# Patient Record
Sex: Female | Born: 1980 | Race: White | Hispanic: No | Marital: Married | Smoking: Current every day smoker
Health system: Southern US, Community
[De-identification: ages and names within clinical notes are randomized; demographics above are authoritative.]

## PROBLEM LIST (undated history)

## (undated) DIAGNOSIS — J45909 Unspecified asthma, uncomplicated: Secondary | ICD-10-CM

## (undated) DIAGNOSIS — F32A Depression, unspecified: Secondary | ICD-10-CM

## (undated) DIAGNOSIS — F329 Major depressive disorder, single episode, unspecified: Secondary | ICD-10-CM

## (undated) HISTORY — PX: TONSILLECTOMY: SUR1361

## (undated) HISTORY — PX: TUBAL LIGATION: SHX77

## (undated) HISTORY — PX: CHOLECYSTECTOMY: SHX55

---

## 2008-08-11 ENCOUNTER — Ambulatory Visit: Payer: Self-pay | Admitting: Family Medicine

## 2008-08-11 DIAGNOSIS — F329 Major depressive disorder, single episode, unspecified: Secondary | ICD-10-CM | POA: Insufficient documentation

## 2008-08-11 DIAGNOSIS — J45909 Unspecified asthma, uncomplicated: Secondary | ICD-10-CM | POA: Insufficient documentation

## 2008-08-11 LAB — CONVERTED CEMR LAB: Rapid Strep: NEGATIVE

## 2009-01-14 ENCOUNTER — Ambulatory Visit: Payer: Self-pay | Admitting: Family Medicine

## 2009-01-15 ENCOUNTER — Encounter: Payer: Self-pay | Admitting: Family Medicine

## 2009-02-22 ENCOUNTER — Ambulatory Visit: Payer: Self-pay | Admitting: Family Medicine

## 2009-11-15 ENCOUNTER — Encounter: Admission: RE | Admit: 2009-11-15 | Discharge: 2009-11-15 | Payer: Self-pay | Admitting: Family Medicine

## 2010-06-12 ENCOUNTER — Ambulatory Visit (HOSPITAL_COMMUNITY): Payer: Self-pay | Admitting: Psychology

## 2010-06-28 ENCOUNTER — Ambulatory Visit (HOSPITAL_COMMUNITY): Payer: Self-pay | Admitting: Psychology

## 2010-07-19 ENCOUNTER — Ambulatory Visit (HOSPITAL_COMMUNITY): Payer: Self-pay | Admitting: Psychology

## 2010-08-29 ENCOUNTER — Ambulatory Visit
Admission: RE | Admit: 2010-08-29 | Discharge: 2010-08-29 | Payer: Self-pay | Source: Home / Self Care | Admitting: Emergency Medicine

## 2010-08-29 ENCOUNTER — Encounter (INDEPENDENT_AMBULATORY_CARE_PROVIDER_SITE_OTHER): Payer: Self-pay | Admitting: *Deleted

## 2010-09-07 NOTE — Letter (Signed)
Summary: Out of Work  [facility]  [address]   [place], [place] [address]   Phone: [phone]  Fax: [phone]    August 29, 2010   Employee:  Taytum LYNN THOMPSON Blitch    To Whom It May Concern:   For Medical reasons, please excuse the above named employee from work for the following dates:  August 29, 2010  If you need additional information, please feel free to contact our office.         Sincerely,    Lajean Saver RN

## 2010-09-07 NOTE — Assessment & Plan Note (Signed)
Summary: EAR PAIN,SORE THROAT,COUGH/TJ   Vital Signs:  Patient Profile:   30 Years Old Female CC:      Rt ear pain, scratchy throat,  Height:     63 inches Weight:      197 pounds O2 Sat:      98 % O2 treatment:    Room Air Temp:     98.9 degrees F oral Pulse rate:   87 / minute Pulse rhythm:   regular Resp:     14 per minute BP sitting:   119 / 84  (left arm) Cuff size:   regular  Vitals Entered By: Emilio Math (August 29, 2010 6:30 PM)                  Current Allergies: No known allergies History of Present Illness Chief Complaint: Rt ear pain, scratchy throat,  History of Present Illness: R constant ear pain, scratchy throat x2 days.  Cough x5 days.  No F/C/N/V.  Otherwise is feeling ok.  Tried OTC mucinex which didn't help much.    REVIEW OF SYSTEMS Constitutional Symptoms      Denies fever, chills, night sweats, weight loss, weight gain, and fatigue.  Eyes       Denies change in vision, eye pain, eye discharge, glasses, contact lenses, and eye surgery. Ear/Nose/Throat/Mouth       Complains of ear pain and sore throat.      Denies hearing loss/aids, change in hearing, ear discharge, dizziness, frequent runny nose, frequent nose bleeds, sinus problems, hoarseness, and tooth pain or bleeding.  Respiratory       Complains of dry cough.      Denies productive cough, wheezing, shortness of breath, asthma, bronchitis, and emphysema/COPD.  Cardiovascular       Denies murmurs, chest pain, and tires easily with exhertion.    Gastrointestinal       Denies stomach pain, nausea/vomiting, diarrhea, constipation, blood in bowel movements, and indigestion. Genitourniary       Denies painful urination, kidney stones, and loss of urinary control. Neurological       Denies paralysis, seizures, and fainting/blackouts. Musculoskeletal       Denies muscle pain, joint pain, joint stiffness, decreased range of motion, redness, swelling, muscle weakness, and gout.  Skin  Denies bruising, unusual mles/lumps or sores, and hair/skin or nail changes.  Psych       Denies mood changes, temper/anger issues, anxiety/stress, speech problems, depression, and sleep problems.  Past History:  Past Medical History: Reviewed history from 08/11/2008 and no changes required. Asthma Depression  Past Surgical History: Reviewed history from 08/11/2008 and no changes required. Tubal ligation  Family History: Reviewed history from 02/22/2009 and no changes required. Mother, Healthy Father, Diabetes Sister, Healthy Brother, Healthy  Social History: Reviewed history from 02/22/2009 and no changes required. 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  R canal normal.  L ear is normal. Nasal: mucosa pink, nonedematous, no septal deviation, turbinates normal Oral/Pharynx: tongue normal, posterior pharynx without erythema or exudate Chest/Lungs: no rales, wheezes, or rhonchi bilateral, breath sounds equal without effort Heart: regular rate and  rhythm, no murmur MSE: oriented to time, place, and person Assessment New Problems: OTITIS MEDIA, RIGHT (ICD-382.9)   Plan New Medications/Changes: CHERATUSSIN AC 100-10 MG/5ML SYRP (GUAIFENESIN-CODEINE) 10cc at bedtime as  needed cough  #4oz x 0, 08/29/2010, Hoyt Koch MD AMOXICILLIN 875 MG TABS (AMOXICILLIN) 1 by mouth two times a day for 1 week  #14 x 0, 08/29/2010, Hoyt Koch MD  New Orders: Est. Patient Level III [phone] Planning Comments:   Rx for Amox for ear infection Rx for Cheratussin at bedtime for coughing.  OTC cough meds during the day Ibu as needed, hydration, rest Work note for today Follow-up with your primary care physician if not improving or if getting worse   The  patient and/or caregiver has been counseled thoroughly with regard to medications prescribed including dosage, schedule, interactions, rationale for use, and possible side effects and they verbalize understanding.  Diagnoses and expected course of recovery discussed and will return if not improved as expected or if the condition worsens. Patient and/or caregiver verbalized understanding.  Prescriptions: CHERATUSSIN AC 100-10 MG/5ML SYRP (GUAIFENESIN-CODEINE) 10cc at bedtime as needed cough  #4oz x 0   Entered and Authorized by:   Hoyt Koch MD   Signed by:   Hoyt Koch MD on 08/29/2010   Method used:   Handwritten   RxID:   [id number] AMOXICILLIN 875 MG TABS (AMOXICILLIN) 1 by mouth two times a day for 1 week  #14 x 0   Entered and Authorized by:   Hoyt Koch MD   Signed by:   Hoyt Koch MD on 08/29/2010   Method used:   Handwritten   RxID:   [id number]   Orders Added: 1)  Est. Patient Level III [29528]

## 2010-09-27 ENCOUNTER — Encounter: Payer: Self-pay | Admitting: Family Medicine

## 2010-09-27 ENCOUNTER — Ambulatory Visit (INDEPENDENT_AMBULATORY_CARE_PROVIDER_SITE_OTHER): Payer: 59 | Admitting: Family Medicine

## 2010-09-27 DIAGNOSIS — J209 Acute bronchitis, unspecified: Secondary | ICD-10-CM

## 2010-09-27 DIAGNOSIS — J45909 Unspecified asthma, uncomplicated: Secondary | ICD-10-CM

## 2010-09-28 ENCOUNTER — Telehealth (INDEPENDENT_AMBULATORY_CARE_PROVIDER_SITE_OTHER): Payer: Self-pay | Admitting: *Deleted

## 2010-10-03 NOTE — Letter (Signed)
Summary: Out of Work  [facility]  [address]   [place], [place] [address]   Phone: [phone]  Fax: [phone]    September 27, 2010   Employee:  Rukiya LYNN THOMPSON Zachow    To Whom It May Concern:  Laura Juarez was evaluated in our clinic this afternoon.    If you need additional information, please feel free to contact our office.         Sincerely,    Donna Christen MD

## 2010-10-03 NOTE — Assessment & Plan Note (Signed)
Summary: EAR PAIN,COUGH,WHEEZING AT NIGHT/TJ rm 4   Vital Signs:  Patient Profile:   30 Years Old Female CC:      cough, ear pain Height:     63 inches Weight:      202 pounds O2 Sat:      97 % O2 treatment:    Room Air Temp:     98.6 degrees F oral Pulse rate:   80 / minute Resp:     16 per minute BP sitting:   111 / 66  (left arm) Cuff size:   regular  Vitals Entered By: Clemens Catholic LPN (September 27, 2010 12:35 PM)                  Updated Prior Medication List: [facility] ALLERGY RELIEF 10 MG TBDP (LORATADINE) 1 tab by mouth once daily CYMBALTA 30 MG CPEP (DULOXETINE HCL) as directed [facility] LORATADINE 10 MG TABS (LORATADINE) as directed  Current Allergies (reviewed today): No known allergies History of Present Illness Chief Complaint: cough, ear pain History of Present Illness:  Subjective:  Patient reports that she improved somewhat after previous visit, but cough lingered on, and about two weeks ago she became worse with a pressure-like discomfort over the anterior chest, increased cough and sinus congestion.  Over the past two days she has had sweats at night.  No shortness of breath but wheezes.  Cough generally non-productive.  REVIEW OF SYSTEMS Constitutional Symptoms      Denies fever, chills, night sweats, weight loss, weight gain, and fatigue.  Eyes       Denies change in vision, eye pain, eye discharge, glasses, contact lenses, and eye surgery. Ear/Nose/Throat/Mouth       Complains of ear pain.      Denies hearing loss/aids, change in hearing, ear discharge, dizziness, frequent runny nose, frequent nose bleeds, sinus problems, sore throat, hoarseness, and tooth pain or bleeding.  Respiratory       Complains of dry cough and wheezing.      Denies productive cough, shortness of breath, asthma, bronchitis, and emphysema/COPD.  Cardiovascular       Denies murmurs, chest pain, and tires easily with exhertion.    Gastrointestinal       Denies stomach pain,  nausea/vomiting, diarrhea, constipation, blood in bowel movements, and indigestion. Genitourniary       Denies painful urination, kidney stones, and loss of urinary control. Neurological       Denies paralysis, seizures, and fainting/blackouts. Musculoskeletal       Denies muscle pain, joint pain, joint stiffness, decreased range of motion, redness, swelling, muscle weakness, and gout.  Skin       Denies bruising, unusual mles/lumps or sores, and hair/skin or nail changes.  Psych       Denies mood changes, temper/anger issues, anxiety/stress, speech problems, depression, and sleep problems. Other Comments: pt c/o dry cough, tickle in throat, chest feels heavy, and RT ear pain x 1wk. she has taken Advil cold and sinus.   Past History:  Past Medical History: Reviewed history from 08/11/2008 and no changes required. Asthma Depression  Past Surgical History: Reviewed history from 08/11/2008 and no changes required. Tubal ligation  Family History: Reviewed history from 02/22/2009 and no changes required. Mother, Healthy Father, Diabetes Sister, Healthy Brother, Healthy  Social History: Reviewed history from 02/22/2009 and no changes required. 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  Extraocular movement is intact.  Conjunctivae are not inflamed.  Ears:  Canals normal.  Tympanic membranes normal.   Nose:  Normal septum.  Normal turbinates, mildly congested.   No sinus tenderness present.  Pharynx:  Normal  Neck:  Supple.  Slightly tender shotty anterior/posterior nodes are palpated bilaterally.  Lungs:  Clear to auscultation.  Breath sounds are equal.  Chest:  Tenderness over mid-sternum Heart:  Regular rate and rhythm without murmurs, rubs, or gallops.  Abdomen:   Nontender without masses or hepatosplenomegaly.  Bowel sounds are present.  No CVA or flank tenderness.  Extremities:  No edema.  Pedal pulses are full and equal.  Skin:  No rash Assessment New Problems: BRONCHITIS, ACUTE (ICD-466.0)  SUSPECT NEW VIRAL URI, NOW WITH DEVELOPING BRONCHITIS  Plan New Medications/Changes: BENZONATATE 200 MG CAPS (BENZONATATE) One by mouth hs as needed cough  #12 x 0, 09/27/2010, Donna Christen MD FLUTICASONE PROPIONATE 50 MCG/ACT SUSP (FLUTICASONE PROPIONATE) 2 sprays in each nostril once daily  #One x 1, 09/27/2010, Donna Christen MD CLARITHROMYCIN 500 MG TABS (CLARITHROMYCIN) One Tab by mouth two times a day  #20 x 0, 09/27/2010, Donna Christen MD PREDNISONE 10 MG TABS (PREDNISONE) 2 PO BID for 2 days, then 1 BID for 2 days, then 1 daily for 2 days.  Take PC  #14 x 0, 09/27/2010, Donna Christen MD  New Orders: Pulse Oximetry (single measurment) [94760] Est. Patient Level III [16109] Planning Comments:   Begin Biaxin, tapering course of prednsone, expectorant/decongestant, fluticasone nasal spray, cough suppressant at bedtime.  Increase fluid intake Followup with PCP if not improving 7 to 10 days   The patient and/or caregiver has been counseled thoroughly with regard to medications prescribed including dosage, schedule, interactions, rationale for use, and possible side effects and they verbalize understanding.  Diagnoses and expected course of recovery discussed and will return if not improved as expected or if the condition worsens. Patient and/or caregiver verbalized understanding.  Prescriptions: BENZONATATE 200 MG CAPS (BENZONATATE) One by mouth hs as needed cough  #12 x 0   Entered and Authorized by:   Donna Christen MD   Signed by:   Donna Christen MD on 09/27/2010   Method used:   Print then Give to Patient   RxID:   [id number] FLUTICASONE PROPIONATE 50 MCG/ACT SUSP (FLUTICASONE PROPIONATE) 2 sprays in each nostril once daily  #One x 1   Entered  and Authorized by:   Donna Christen MD   Signed by:   Donna Christen MD on 09/27/2010   Method used:   Print then Give to Patient   RxID:   [id number] CLARITHROMYCIN 500 MG TABS (CLARITHROMYCIN) One Tab by mouth two times a day  #20 x 0   Entered and Authorized by:   Donna Christen MD   Signed by:   Donna Christen MD on 09/27/2010   Method used:   Print then Give to Patient   RxID:   [id number] PREDNISONE 10 MG TABS (PREDNISONE) 2 PO BID for 2 days, then 1 BID for 2 days, then 1 daily for 2 days.  Take PC  #14 x 0   Entered and Authorized by:   Donna Christen MD   Signed by:   Donna Christen MD on 09/27/2010   Method used:   Print then Give to Patient   RxID:   [id number]   Patient Instructions: 1)  Take Mucinex D (  guaifenesin with decongestant) twice daily for congestion. 2)  Increase fluid intake, rest. 3)  May use Afrin nasal spray (or generic oxymetazoline) twice daily for about 5 days (use Afrin about 15 minutes before using prescription nose spray).   Also recommend using saline nasal spray several times daily and/or saline nasal irrigation. 4)    5)  Followup with ENT doctor if not improving 7 to 10 days.   Orders Added: 1)  Pulse Oximetry (single measurment) [94760] 2)  Est. Patient Level III [98119]    Laboratory Results  Date/Time Received: September 27, 2010 1:28 PM  Date/Time Reported: September 27, 2010 1:28 PM   Other Tests  Rapid Strep: negative  Kit Test Internal QC: Negative   (Normal Range: Negative)

## 2010-10-03 NOTE — Progress Notes (Signed)
  Phone Note Outgoing Call   Call placed by: Clemens Catholic LPN,  September 28, 2010 10:24 AM Call placed to: Patient Summary of Call: call back: pts phone number has been disconnected. Initial call taken by: Clemens Catholic LPN,  September 28, 2010 10:24 AM

## 2010-10-03 NOTE — Assessment & Plan Note (Signed)
Summary: Additional testing  Rapid strep test negative

## 2010-10-29 ENCOUNTER — Encounter: Payer: Self-pay | Admitting: Emergency Medicine

## 2010-10-29 ENCOUNTER — Inpatient Hospital Stay (INDEPENDENT_AMBULATORY_CARE_PROVIDER_SITE_OTHER)
Admission: RE | Admit: 2010-10-29 | Discharge: 2010-10-29 | Disposition: A | Discharge status: Home / Self Care | Payer: 59 | Source: Ambulatory Visit | Attending: Emergency Medicine | Admitting: Emergency Medicine

## 2010-10-29 DIAGNOSIS — J069 Acute upper respiratory infection, unspecified: Secondary | ICD-10-CM

## 2010-10-29 DIAGNOSIS — M549 Dorsalgia, unspecified: Secondary | ICD-10-CM

## 2010-11-07 NOTE — Assessment & Plan Note (Signed)
Summary: POSS SINUS INF AND LOWER BACK PAIN/WSE(rm5)   Vital Signs:  Patient Profile:   30 Years Old Female CC:      Cold & URI symptoms/back pain Height:     63 inches Weight:      206.8 pounds O2 Sat:      99 % O2 treatment:    Room Air Temp:     98.9 degrees F oral Pulse rate:   84 / minute Resp:     20 per minute BP sitting:   114 / 76  (left arm) Cuff size:   regular  Pt. in pain?   yes    Location:   back    Intensity:   7    Type:       throbbing  Vitals Entered By: Linton Flemings RN (October 29, 2010 12:11 PM)                   Updated Prior Medication List: [facility] ALLERGY RELIEF 10 MG TBDP (LORATADINE) 1 tab by mouth once daily CYMBALTA 30 MG CPEP (DULOXETINE HCL) as directed [facility] LORATADINE 10 MG TABS (LORATADINE) as directed PREDNISONE 10 MG TABS (PREDNISONE) 2 PO BID for 2 days, then 1 BID for 2 days, then 1 daily for 2 days.  Take PC CLARITHROMYCIN 500 MG TABS (CLARITHROMYCIN) One Tab by mouth two times a day FLUTICASONE PROPIONATE 50 MCG/ACT SUSP (FLUTICASONE PROPIONATE) 2 sprays in each nostril once daily BENZONATATE 200 MG CAPS (BENZONATATE) One by mouth hs as needed cough  Current Allergies: No known allergies History of Present Illness History from: patient Chief Complaint: Cold & URI symptoms/back pain History of Present Illness: 1) Back pain since this morning.  Was picking clothes off the floor and felt tight spasm.  She recently felt the same way in her neck, went to ER, given Flexeril. The patient presents today with back pain.  Today with constant soreness in L lower back. Worse with: movement Better with: rest Trauma: no Bladder/bowel incontinence: no Weakness: no Fever/chills: no Night pain: no Unexplained weight loss: no Cancer/immunosuppression: no PMH of osteoporosis or chronic steroid use: no   50) 30 Years Old Female complains of onset of cold symptoms for 2-3 days.  Hedda has been using nothign OTC. + sore throat + cough No  pleuritic pain No wheezing + nasal congestion + post-nasal drainage No sinus pain/pressure No chest congestion No itchy/red eyes + earache No hemoptysis No SOB No chills/sweats No fever No nausea No vomiting No abdominal pain No diarrhea No skin rashes No fatigue No myalgias No headache   REVIEW OF SYSTEMS Constitutional Symptoms       Complains of fever.     Denies chills, night sweats, weight loss, weight gain, and fatigue.  Eyes       Denies change in vision, eye pain, eye discharge, glasses, contact lenses, and eye surgery. Ear/Nose/Throat/Mouth       Complains of ear pain and sore throat.      Denies hearing loss/aids, change in hearing, ear discharge, dizziness, frequent runny nose, frequent nose bleeds, sinus problems, hoarseness, and tooth pain or bleeding.  Respiratory       Complains of dry cough, wheezing, and shortness of breath.      Denies productive cough, asthma, bronchitis, and emphysema/COPD.  Cardiovascular       Denies murmurs, chest pain, and tires easily with exhertion.    Gastrointestinal       Denies stomach pain, nausea/vomiting, diarrhea, constipation, blood  in bowel movements, and indigestion. Genitourniary       Denies painful urination, kidney stones, and loss of urinary control. Neurological       Complains of weakness.      Denies headaches, loss of or changes in sensation, numbness, tngling, tremors, paralysis, seizures, and fainting/blackouts. Musculoskeletal       Denies muscle pain, joint pain, joint stiffness, decreased range of motion, redness, swelling, muscle weakness, and gout.  Skin       Denies bruising, unusual mles/lumps or sores, and hair/skin or nail changes.  Psych       Denies mood changes, temper/anger issues, anxiety/stress, speech problems, depression, and sleep problems. Other Comments: back pain started this am picking up a pair of pants. Treated for URI last month improved some but now symptoms are coming  back   Past History:  Past Medical History: Reviewed history from 08/11/2008 and no changes required. Asthma Depression  Past Surgical History: Reviewed history from 08/11/2008 and no changes required. Tubal ligation  Family History: Reviewed history from 02/22/2009 and no changes required. Mother, Healthy Father, Diabetes Sister, Healthy Brother, Healthy  Social History: Reviewed history from 02/22/2009 and no changes required. 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 General appearance: well developed, well nourished, no acute distress Ears: normal, no lesions or deformities Nasal: clear discharge Oral/Pharynx: pharyngeal mild erythema without exudate, uvula midline without deviation Chest/Lungs: no rales, wheezes, or rhonchi bilateral, breath sounds equal without effort Heart: regular rate and  rhythm, no murmur Back: FROM, SLR neg, mild L paraspinal lumbar spasms and tenderness, no central/bony tenderness MSE: oriented to time, place, and person Assessment New Problems: UPPER RESPIRATORY INFECTION, ACUTE (ICD-465.9) BACK PAIN (ICD-724.5)   Plan New Medications/Changes: TRAMADOL HCL 50 MG TABS (TRAMADOL HCL) 1 by mouth q8 hrs as needed for pain  #15 x 0, 10/29/2010, Hoyt Koch MD PREDNISONE 20 MG TABS (PREDNISONE) 1 by mouth two times a day for 4 days, no taper  #QS x 0, 10/29/2010, Hoyt Koch MD FLEXERIL 10 MG TABS (CYCLOBENZAPRINE HCL) 1 by mouth three times a day as needed for spasms  #20 x 0, 10/29/2010, Hoyt Koch MD  New Orders: Est. Patient Level IV [phone] Services provided After hours-Weekends-Holidays [99051] Rapid Strep [14782] Planning Comments:   1) LBP, muscle spasm.  Heating pad.  Rx for Tramadol, Prednisone, Flexeril.  If neck/back pain is recurrent, will need to see PT and/or back ortho. 2) Rapid strep negative.  No antibiotic given since viral at this time.  Use nasal saline solution (over the counter) at least 3 times a day. Use over the counter decongestants like Zyrtec-D every 12 hours as needed to help with congestion.  Follow up with your primary doctor  if no improvement in 5-7 days, sooner if increasing pain, fever, or new symptoms.    The patient and/or caregiver has been counseled thoroughly with regard to medications prescribed including dosage, schedule, interactions, rationale for use, and possible side effects and they verbalize understanding.  Diagnoses and expected course of recovery discussed and will return if not improved as expected or if the condition worsens. Patient and/or caregiver verbalized understanding.  Prescriptions: TRAMADOL HCL 50 MG TABS (TRAMADOL HCL) 1 by mouth q8 hrs as needed for pain  #15 x 0   Entered and Authorized by:   Hoyt Koch MD   Signed by:   Hoyt Koch MD on 10/29/2010  Method used:   Print then Give to Patient   RxID:   [id number] PREDNISONE 20 MG TABS (PREDNISONE) 1 by mouth two times a day for 4 days, no taper  #QS x 0   Entered and Authorized by:   Hoyt Koch MD   Signed by:   Hoyt Koch MD on 10/29/2010   Method used:   Print then Give to Patient   RxID:   [id number] FLEXERIL 10 MG TABS (CYCLOBENZAPRINE HCL) 1 by mouth three times a day as needed for spasms  #20 x 0   Entered and Authorized by:   Hoyt Koch MD   Signed by:   Hoyt Koch MD on 10/29/2010   Method used:   Print then Give to Patient   RxID:   [id number]   Orders Added: 1)  Est. Patient Level IV [03474] 2)  Services provided After hours-Weekends-Holidays [99051] 3)  Rapid Strep [25956]    Laboratory Results  Date/Time Received: October 29, 2010 12:30 PM  Date/Time Reported: October 29, 2010 12:30 PM   Other Tests  Rapid Strep: negative  Kit Test Internal QC: Negative   (Normal Range: Negative)

## 2011-01-16 IMAGING — CT CT ABD-PELV W/ CM
2 of 4 series · 17 of 46 positions shown, 19 images · IV contrast (agent unspecified)
Comparison: None.

CLINICAL DATA: Right lower quadrant pain for 4 days

CT ABDOMEN AND PELVIS WITH CONTRAST
TECHNIQUE: Multidetector CT imaging of the abdomen and pelvis was
performed following the standard protocol during bolus
administration of intravenous contrast.
Contrast: 100 ml Ymnipaque-T55

[Series 2: portal · axial · portal-venous · 0.70mm/px · z∈[+370,+775]mm · 14 of 89 slices shown, 16 images]
[im 4/89  soft-tissue]
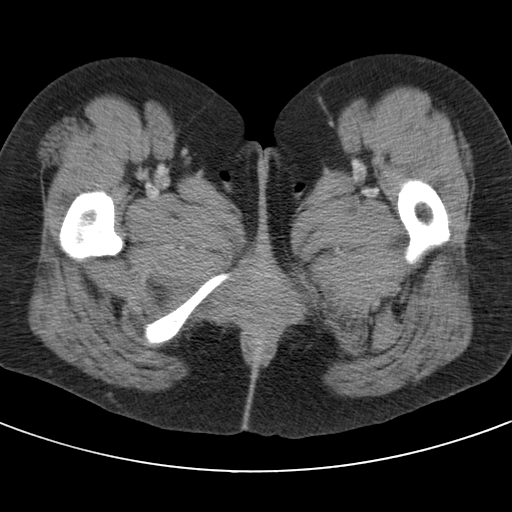
[im 4/89  bone]
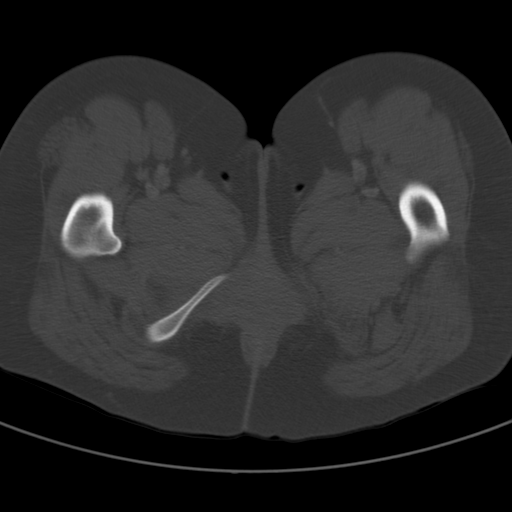
[im 12/89  soft-tissue]
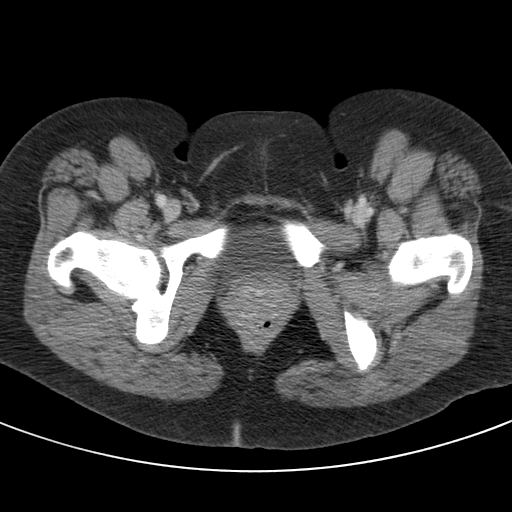
[im 19/89  soft-tissue]
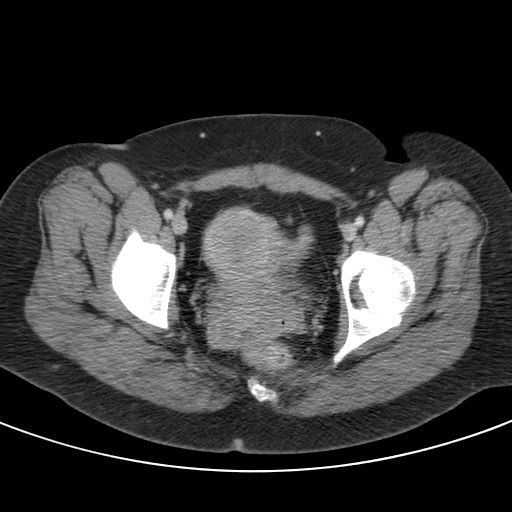
[im 23/89  soft-tissue]
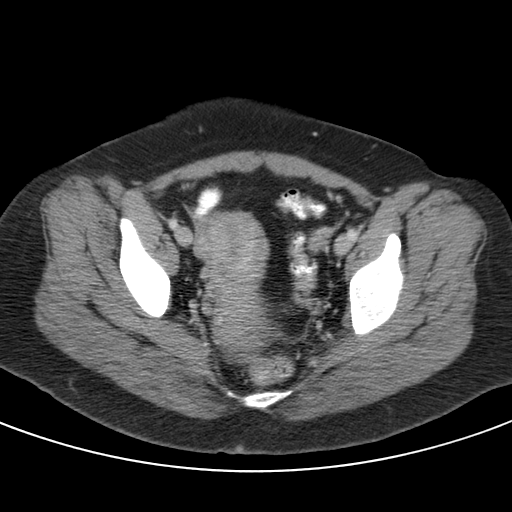
[im 30/89  soft-tissue]
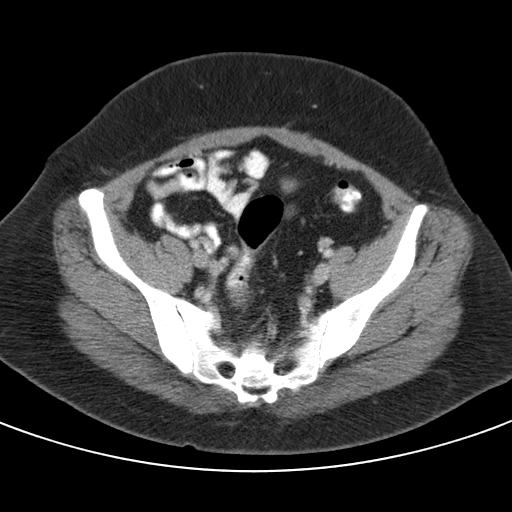
[im 37/89  soft-tissue]
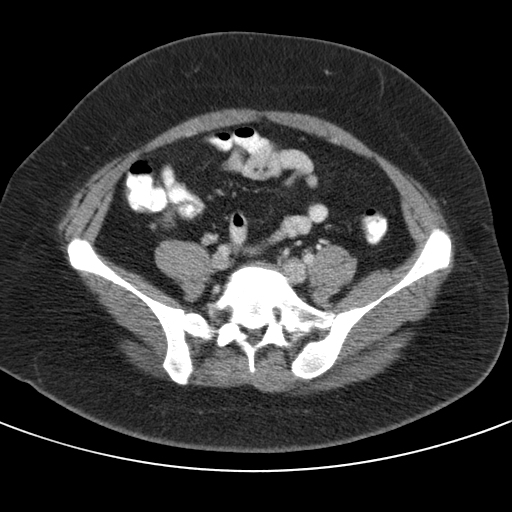
[im 41/89  soft-tissue]
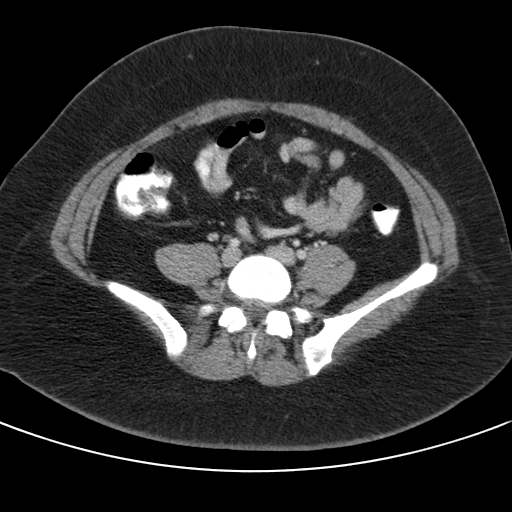
[im 48/89  soft-tissue]
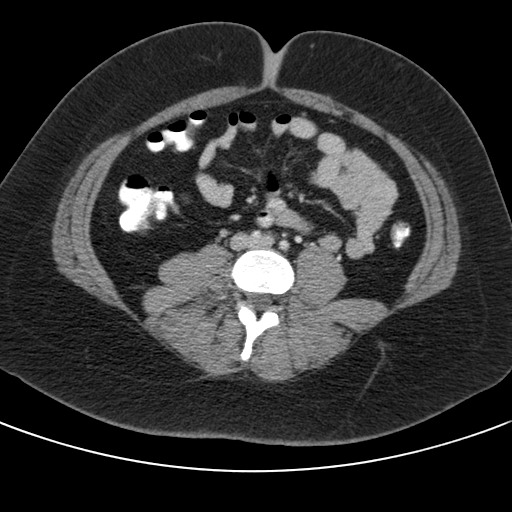
[im 52/89  soft-tissue]
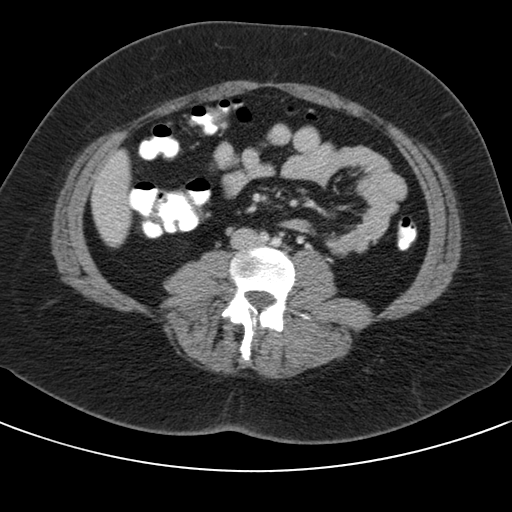
[im 52/89  bone]
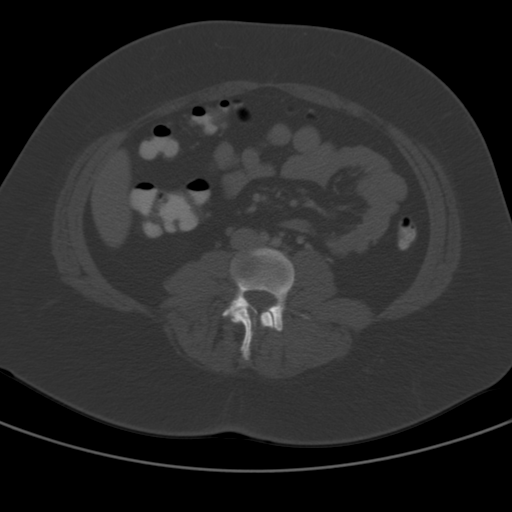
[im 59/89  soft-tissue]
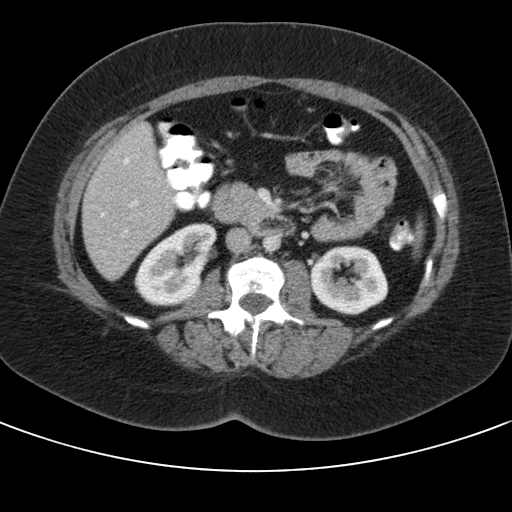
[im 67/89  soft-tissue]
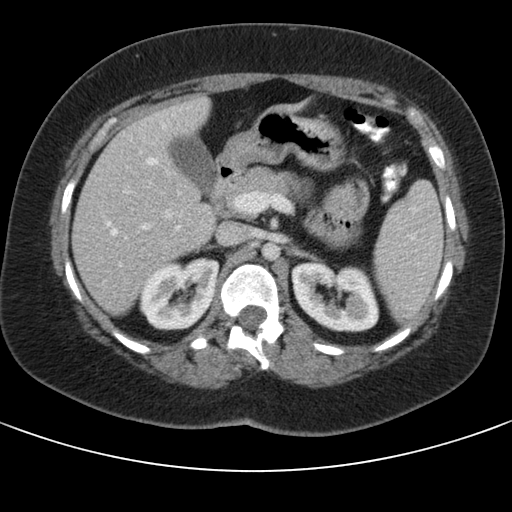
[im 70/89  soft-tissue]
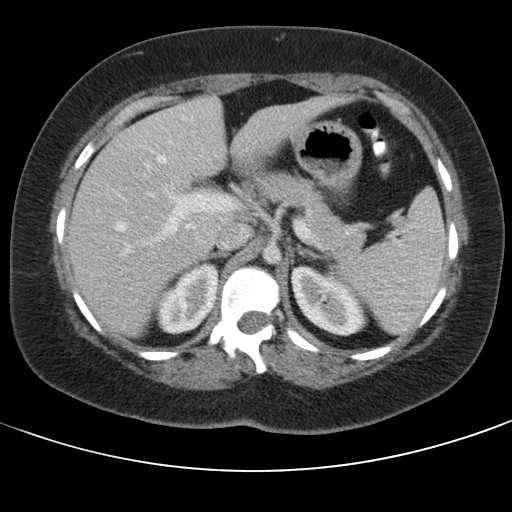
[im 78/89  soft-tissue]
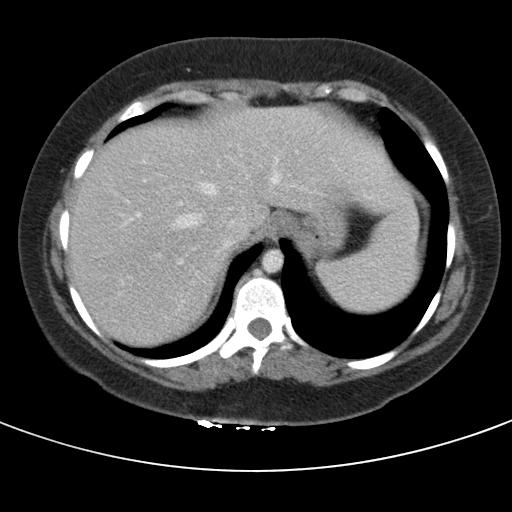
[im 85/89  soft-tissue]
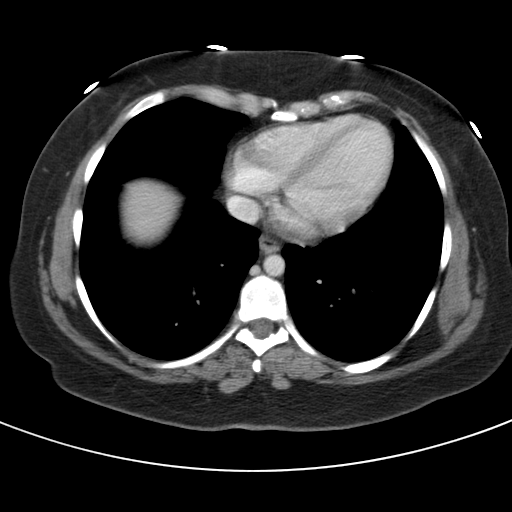

[cor · coronal · 0.86mm/px · 3 of 76 slices shown]
[im 26/76  soft-tissue]
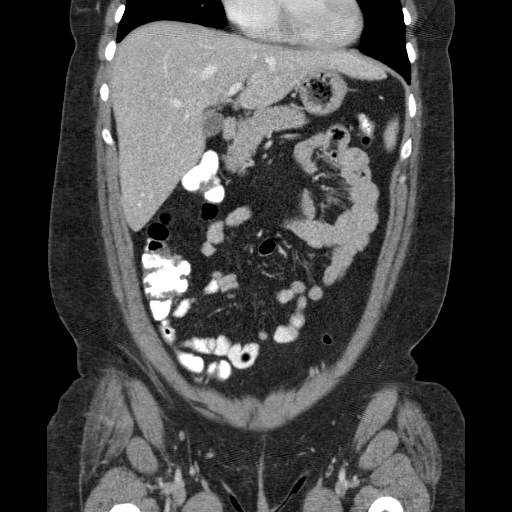
[im 34/76  soft-tissue]
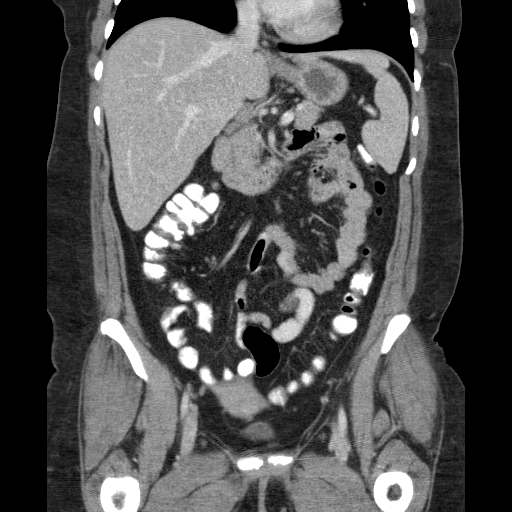
[im 42/76  soft-tissue]
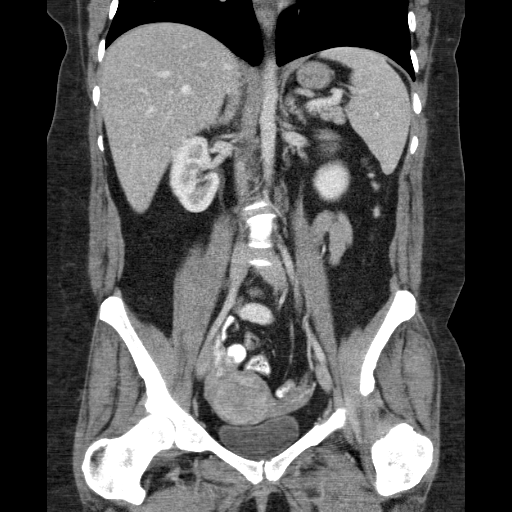

[17 of 46 positions shown; findings below may reference images not displayed]

FINDINGS: The lung bases are clear.  The liver enhances with no
focal abnormality and no ductal dilatation is seen.  No calcified
gallstones are noted.  The pancreas is normal in size and the
pancreatic duct is not dilated.  The adrenal glands and spleen are
unremarkable.  The kidneys enhance with no calculus or mass and no
hydronephrosis is seen.  Abdominal aorta is normal in caliber.  No
adenopathy is noted.

The appendix is visualized in the right lower quadrant and there is
no evidence of appendicitis.  The terminal ileum is well visualized
and appears normal.  The urinary bladder decompressed.  The uterus
is somewhat inhomogeneous and uterine fibroid cannot be excluded.
There does appear to be a collapsing right ovarian cyst present
with only a small amount of fluid in the pelvis.  The left ovary is
unremarkable.  L1 and L2 vertebral bodies appear congenitally
fused.
IMPRESSION: 1.  The appendix and terminal ileum are well seen and appear
normal.
2.  Probable collapsing right ovarian cyst with small amount of
free fluid in the pelvis.
3.  Somewhat inhomogeneous uterus may represent uterine fibroid.

## 2014-03-03 ENCOUNTER — Emergency Department (HOSPITAL_BASED_OUTPATIENT_CLINIC_OR_DEPARTMENT_OTHER)
Admission: EM | Admit: 2014-03-03 | Discharge: 2014-03-04 | Disposition: A | Discharge status: Home / Self Care | Payer: BC Managed Care – PPO | Attending: Emergency Medicine | Admitting: Emergency Medicine

## 2014-03-03 ENCOUNTER — Encounter (HOSPITAL_BASED_OUTPATIENT_CLINIC_OR_DEPARTMENT_OTHER): Payer: Self-pay | Admitting: Emergency Medicine

## 2014-03-03 DIAGNOSIS — F329 Major depressive disorder, single episode, unspecified: Secondary | ICD-10-CM | POA: Insufficient documentation

## 2014-03-03 DIAGNOSIS — R109 Unspecified abdominal pain: Secondary | ICD-10-CM | POA: Insufficient documentation

## 2014-03-03 DIAGNOSIS — J45909 Unspecified asthma, uncomplicated: Secondary | ICD-10-CM | POA: Insufficient documentation

## 2014-03-03 DIAGNOSIS — Z3202 Encounter for pregnancy test, result negative: Secondary | ICD-10-CM | POA: Insufficient documentation

## 2014-03-03 DIAGNOSIS — N3000 Acute cystitis without hematuria: Secondary | ICD-10-CM

## 2014-03-03 DIAGNOSIS — F17200 Nicotine dependence, unspecified, uncomplicated: Secondary | ICD-10-CM | POA: Insufficient documentation

## 2014-03-03 DIAGNOSIS — F3289 Other specified depressive episodes: Secondary | ICD-10-CM | POA: Insufficient documentation

## 2014-03-03 DIAGNOSIS — R102 Pelvic and perineal pain: Secondary | ICD-10-CM

## 2014-03-03 DIAGNOSIS — N949 Unspecified condition associated with female genital organs and menstrual cycle: Secondary | ICD-10-CM | POA: Insufficient documentation

## 2014-03-03 HISTORY — DX: Unspecified asthma, uncomplicated: J45.909

## 2014-03-03 HISTORY — DX: Major depressive disorder, single episode, unspecified: F32.9

## 2014-03-03 HISTORY — DX: Depression, unspecified: F32.A

## 2014-03-03 LAB — WET PREP, GENITAL
CLUE CELLS WET PREP: NONE SEEN
TRICH WET PREP: NONE SEEN
Yeast Wet Prep HPF POC: NONE SEEN

## 2014-03-03 LAB — URINALYSIS, ROUTINE W REFLEX MICROSCOPIC
Bilirubin Urine: NEGATIVE
Glucose, UA: NEGATIVE mg/dL
Hgb urine dipstick: NEGATIVE
KETONES UR: NEGATIVE mg/dL
NITRITE: NEGATIVE
PH: 5.5 (ref 5.0–8.0)
Protein, ur: NEGATIVE mg/dL
SPECIFIC GRAVITY, URINE: 1.021 (ref 1.005–1.030)
Urobilinogen, UA: 0.2 mg/dL (ref 0.0–1.0)

## 2014-03-03 LAB — URINE MICROSCOPIC-ADD ON

## 2014-03-03 LAB — PREGNANCY, URINE: PREG TEST UR: NEGATIVE

## 2014-03-03 NOTE — ED Notes (Signed)
C/o lower left abd pain and left flank x 2-3 years- worse x 1 week

## 2014-03-03 NOTE — ED Notes (Signed)
Pt c/o abd pain x 8 days  At times gets worse,  Denies buring w urination

## 2014-03-03 NOTE — ED Provider Notes (Signed)
CSN: [id number]     Arrival date & time 03/03/14  2131 History   None    This chart was scribed for Hanley SeamenJohn L Takiya Belmares, MD by Arlan OrganAshley Leger, ED Scribe. This patient was seen in room MH01/MH01 and the patient's care was started 11:03 PM.   Chief Complaint  Patient presents with  . Abdominal Pain   The history is provided by the patient. No language interpreter was used.    HPI Comments: Laura Juarez is a 33 y.o. female who presents to the Emergency Department complaining of intermittent, moderate L sided abdominal pain and L flank pain x 2-3 years that has recently worsened in the last week. She also reports L sided groin pain. She describes pain as "cramping" and rates it 7/10. No alleviating or aggravating factors at this time. She mentions mild vaginal spotting, nausea, and clear vaginal discharge. She denies any chill, dysuria, hematuria or fever. Pt with known allergy to Augmentin. No other concerns this visit.   Past Medical History  Diagnosis Date  . Asthma   . Depression    Past Surgical History  Procedure Laterality Date  . Tubal ligation    . Tonsillectomy     No family history on file. History  Substance Use Topics  . Smoking status: Current Every Day Smoker  . Smokeless tobacco: Not on file  . Alcohol Use: No   OB History   Grav Para Term Preterm Abortions TAB SAB Ect Mult Living                 Review of Systems  All other systems reviewed and are negative.     Allergies  Augmentin  Home Medications   Prior to Admission medications   Medication Sig Start Date End Date Taking? Authorizing Provider  Escitalopram Oxalate (LEXAPRO PO) Take by mouth.   Yes Historical Provider, MD  ibuprofen (ADVIL,MOTRIN) 200 MG tablet Take 200 mg by mouth every 6 (six) hours as needed.   Yes Historical Provider, MD   Triage Vitals: BP 137/83  Pulse 90  Temp(Src) 99.1 F (37.3 C) (Oral)  Resp 18  Ht 5\' 3"  (1.6 m)  Wt 193 lb (87.544 kg)  BMI 34.20 kg/m2  SpO2  100%  LMP 02/12/2014   Physical Exam   General: Well-developed, well-nourished female in no acute distress; appearance consistent with age of record HENT: normocephalic; atraumatic Eyes: pupils equal, round and reactive to light; extraocular muscles intact Neck: supple Heart: regular rate and rhythm; no murmurs, rubs or gallops Lungs: clear to auscultation bilaterally Abdomen: soft; nondistended; no masses or hepatosplenomegaly; bowel sounds present; LLQ tenderness and L inguinal tenderness; no inguinal lymphadenopathy GU: No CVA tenderness; Normal external genitalia; physiologic appearing vaginal discharge; no vaginal bleeding; no CMT; L adnexal tenderness Extremities: No deformity; full range of motion; pulses normal Neurologic: Awake, alert and oriented; motor function intact in all extremities and symmetric; no facial droop Skin: Warm and dry Psychiatric: Normal mood and affect    ED Course  Procedures (including critical care time)  DIAGNOSTIC STUDIES: Oxygen Saturation is 100% on RA, Normal by my interpretation.    COORDINATION OF CARE: 11:05 PM-Discussed treatment plan with pt at bedside and pt agreed to plan.     MDM   Nursing notes and vitals signs, including pulse oximetry, reviewed.  Summary of this visit's results, reviewed by myself:  Labs:  Results for orders placed during the hospital encounter of 03/03/14 (from the past 24 hour(s))  URINALYSIS, ROUTINE  W REFLEX MICROSCOPIC     Status: Abnormal   Collection Time    03/03/14  9:40 PM      Result Value Ref Range   Color, Urine YELLOW  YELLOW   APPearance CLOUDY (*) CLEAR   Specific Gravity, Urine 1.021  1.005 - 1.030   pH 5.5  5.0 - 8.0   Glucose, UA NEGATIVE  NEGATIVE mg/dL   Hgb urine dipstick NEGATIVE  NEGATIVE   Bilirubin Urine NEGATIVE  NEGATIVE   Ketones, ur NEGATIVE  NEGATIVE mg/dL   Protein, ur NEGATIVE  NEGATIVE mg/dL   Urobilinogen, UA 0.2  0.0 - 1.0 mg/dL   Nitrite NEGATIVE  NEGATIVE    Leukocytes, UA MODERATE (*) NEGATIVE  PREGNANCY, URINE     Status: None   Collection Time    03/03/14  9:40 PM      Result Value Ref Range   Preg Test, Ur NEGATIVE  NEGATIVE  URINE MICROSCOPIC-ADD ON     Status: Abnormal   Collection Time    03/03/14  9:40 PM      Result Value Ref Range   Squamous Epithelial / LPF FEW (*) RARE   WBC, UA 7-10  <3 WBC/hpf   Bacteria, UA FEW (*) RARE  WET PREP, GENITAL     Status: Abnormal   Collection Time    03/03/14 11:24 PM      Result Value Ref Range   Yeast Wet Prep HPF POC NONE SEEN  NONE SEEN   Trich, Wet Prep NONE SEEN  NONE SEEN   Clue Cells Wet Prep HPF POC NONE SEEN  NONE SEEN   WBC, Wet Prep HPF POC FEW (*) NONE SEEN   Will have patient return for ultrasound--suspect left ovarian cyst.  I personally performed the services described in this documentation, which was scribed in my presence. The recorded information has been reviewed and is accurate.  Hanley Seamen, MD 03/04/14 [phone]

## 2014-03-04 ENCOUNTER — Ambulatory Visit (HOSPITAL_BASED_OUTPATIENT_CLINIC_OR_DEPARTMENT_OTHER)
Admit: 2014-03-04 | Discharge: 2014-03-04 | Disposition: A | Discharge status: Home / Self Care | Payer: BC Managed Care – PPO | Attending: Emergency Medicine | Admitting: Emergency Medicine

## 2014-03-04 ENCOUNTER — Ambulatory Visit (HOSPITAL_BASED_OUTPATIENT_CLINIC_OR_DEPARTMENT_OTHER)
Admit: 2014-03-04 | Discharge: 2014-03-04 | Disposition: A | Discharge status: Home / Self Care | Payer: BC Managed Care – PPO | Source: Ambulatory Visit | Attending: Emergency Medicine | Admitting: Emergency Medicine

## 2014-03-04 DIAGNOSIS — R1032 Left lower quadrant pain: Secondary | ICD-10-CM | POA: Insufficient documentation

## 2014-03-04 DIAGNOSIS — R110 Nausea: Secondary | ICD-10-CM | POA: Insufficient documentation

## 2014-03-04 MED ORDER — NITROFURANTOIN MONOHYD MACRO 100 MG PO CAPS: 100.0000 mg | ORAL_CAPSULE | Freq: Two times a day (BID) | ORAL | Status: DC

## 2014-03-04 MED ORDER — HYDROCODONE-ACETAMINOPHEN 5-325 MG PO TABS
1.0000 | ORAL_TABLET | Freq: Once | ORAL | Status: AC
  Administered 2014-03-04: 1 via ORAL
  Filled 2014-03-04: qty 1

## 2014-03-04 MED ORDER — HYDROCODONE-ACETAMINOPHEN 5-325 MG PO TABS: 1.0000 | ORAL_TABLET | Freq: Four times a day (QID) | ORAL | Status: DC | PRN

## 2014-03-04 MED ORDER — NITROFURANTOIN MONOHYD MACRO 100 MG PO CAPS
100.0000 mg | ORAL_CAPSULE | Freq: Once | ORAL | Status: AC
  Administered 2014-03-04: 100 mg via ORAL
  Filled 2014-03-04: qty 1

## 2014-03-04 NOTE — ED Provider Notes (Signed)
[place] Transvaginal Non-ob  03/04/2014   CLINICAL DATA:  Left lower quadrant pain with nausea for 1 week.  EXAM: TRANSABDOMINAL AND TRANSVAGINAL ULTRASOUND OF PELVIS  TECHNIQUE: Both transabdominal and transvaginal ultrasound examinations of the pelvis were performed. Transabdominal technique was performed for global imaging of the pelvis including uterus, ovaries, adnexal regions, and pelvic cul-de-sac. It was necessary to proceed with endovaginal exam following the transabdominal exam to visualize the uterus, ovaries, and adnexa.  COMPARISON:  CT of 11/15/2009  FINDINGS: Uterus:  10.5 x 5.2 x 5.8 cm.  Nabothian cysts.  Endometrium:  Normal for age, maximally 1.3 cm.  Right Ovary: 3.6 x 1.9 x 2.4 cm. Suspect a minimally complex follicle within. 1.7 cm on image 59.  Left Ovary:  2.8 x 2.1 x 2.0 cm.  Normal in morphology.  Other Findings:  No significant free fluid.  IMPRESSION: 1.  No acute process or explanation for left lower quadrant pain. 2. Probable complex follicle in the right ovary.   Electronically Signed   By: Jeronimo GreavesKyle  Talbot M.D.   On: 03/04/2014 16:41   [place] Pelvis Complete  03/04/2014   CLINICAL DATA:  Left lower quadrant pain with nausea for 1 week.  EXAM: TRANSABDOMINAL AND TRANSVAGINAL ULTRASOUND OF PELVIS  TECHNIQUE: Both transabdominal and transvaginal ultrasound examinations of the pelvis were performed. Transabdominal technique was performed for global imaging of the pelvis including uterus, ovaries, adnexal regions, and pelvic cul-de-sac. It was necessary to proceed with endovaginal exam following the transabdominal exam to visualize the uterus, ovaries, and adnexa.  COMPARISON:  CT of 11/15/2009  FINDINGS: Uterus:  10.5 x 5.2 x 5.8 cm.  Nabothian cysts.  Endometrium:  Normal for age, maximally 1.3 cm.  Right Ovary: 3.6 x 1.9 x 2.4 cm. Suspect a minimally complex follicle within. 1.7 cm on image 59.  Left Ovary:  2.8 x 2.1 x 2.0 cm.  Normal in morphology.  Other Findings:  No significant free fluid.   IMPRESSION: 1.  No acute process or explanation for left lower quadrant pain. 2. Probable complex follicle in the right ovary.   Electronically Signed   By: Jeronimo GreavesKyle  Talbot M.D.   On: 03/04/2014 16:41    Please call patient with report that she has a probable complex cyst on her right ovary.  She should take report to her gynecologist and they will determine if any follow up is required.  This does not correlate with patient's left sided pain.    Hilario Quarryanielle S Kiela Shisler, MD 03/04/14 [phone]

## 2014-03-05 LAB — URINE CULTURE

## 2014-03-05 LAB — GC/CHLAMYDIA PROBE AMP
CT PROBE, AMP APTIMA: NEGATIVE
GC PROBE AMP APTIMA: NEGATIVE

## 2015-05-05 IMAGING — US US TRANSVAGINAL NON-OB
1 series · 14 of 25 positions shown · non-contrast
Comparison: CT of 11/15/2009

CLINICAL DATA: Left lower quadrant pain with nausea for 1 week.

EXAM:
TRANSABDOMINAL AND TRANSVAGINAL ULTRASOUND OF PELVIS
TECHNIQUE: Both transabdominal and transvaginal ultrasound examinations of the
pelvis were performed. Transabdominal technique was performed for
global imaging of the pelvis including uterus, ovaries, adnexal
regions, and pelvic cul-de-sac. It was necessary to proceed with
endovaginal exam following the transabdominal exam to visualize the
uterus, ovaries, and adnexa.

[Series 1: us transvaginal non-ob · 0.35mm/px · 14 of 64 slices shown]
[im 1/64]
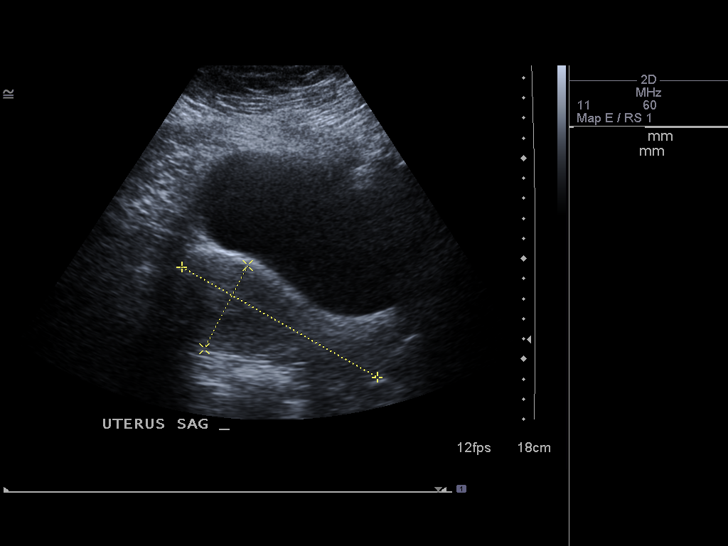
[im 6/64]
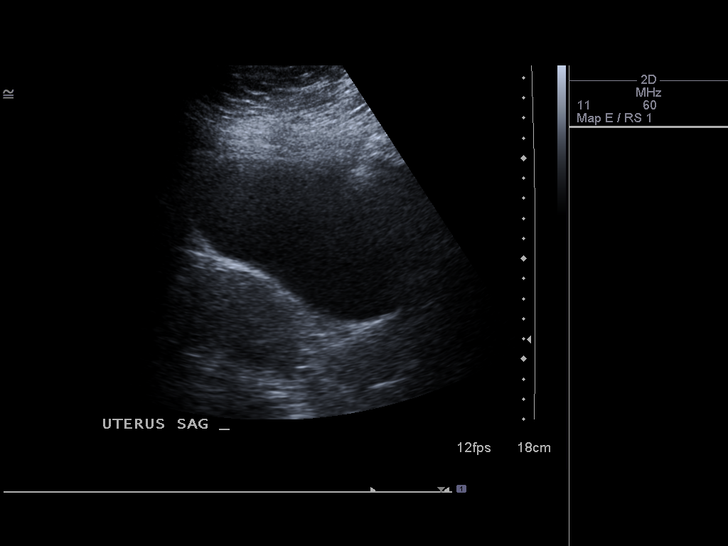
[im 11/64]
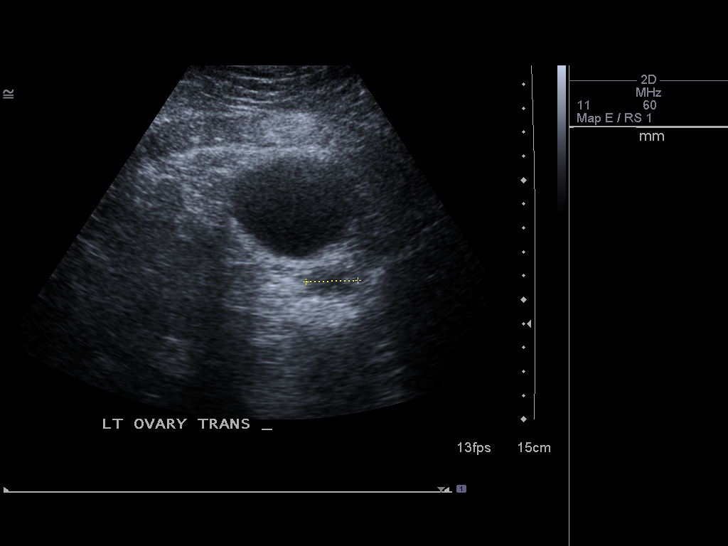
[im 16/64]
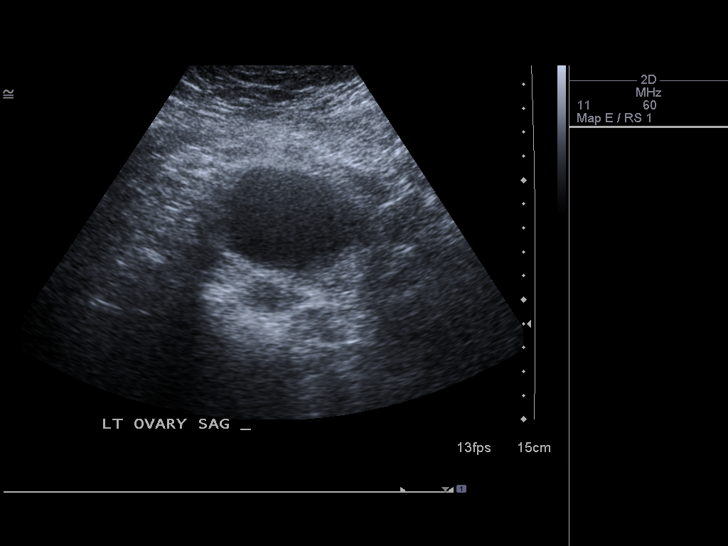
[im 22/64]
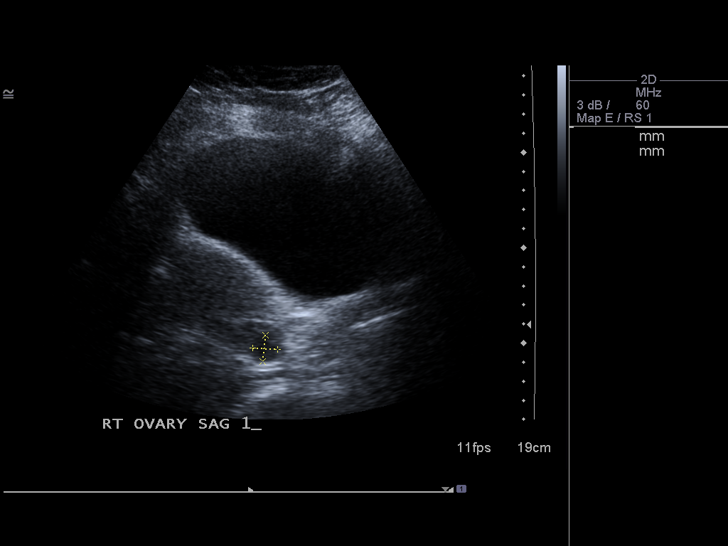
[im 24/64]
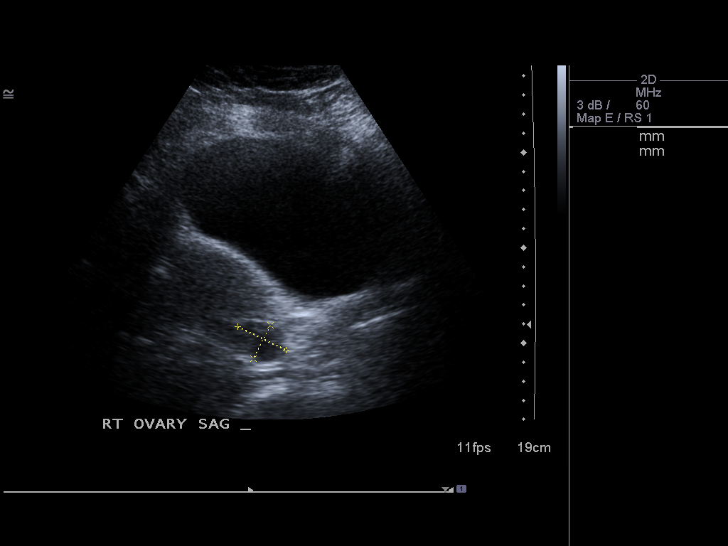
[im 29/64]
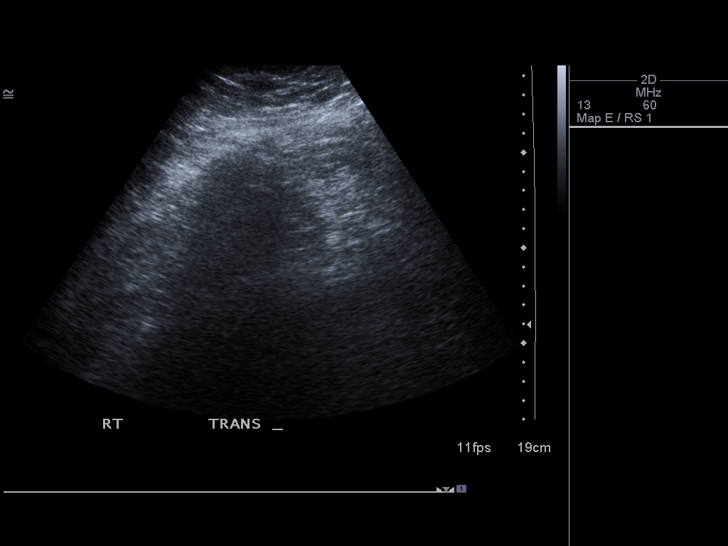
[im 35/64]
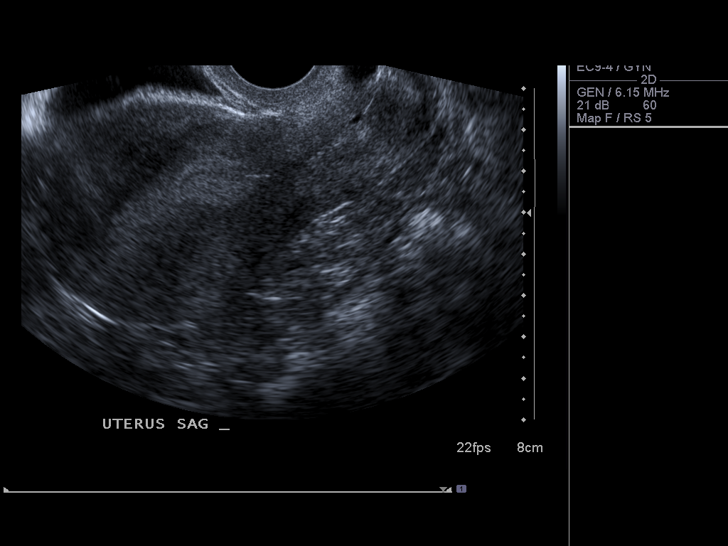
[im 40/64]
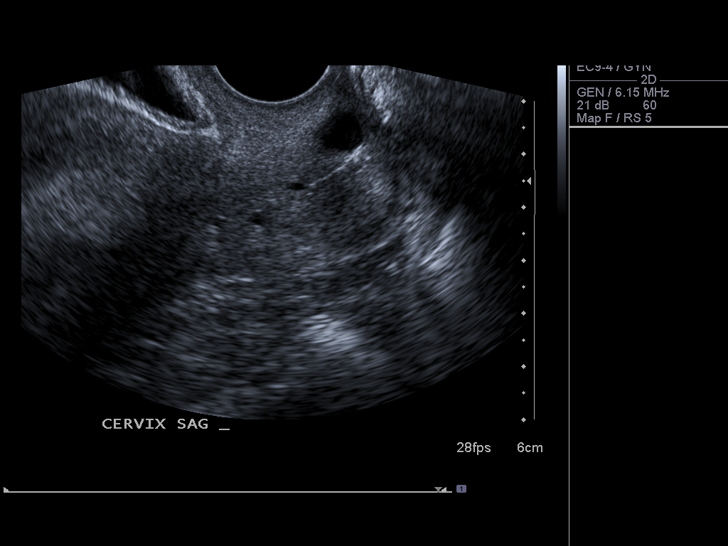
[im 43/64]
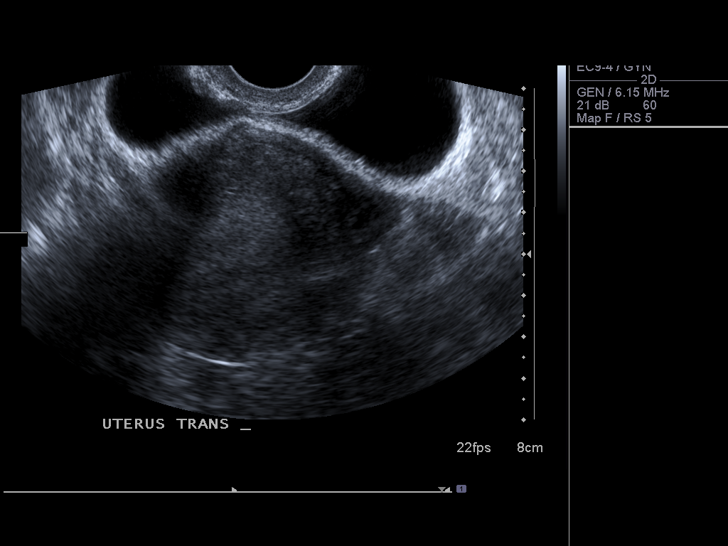
[im 48/64]
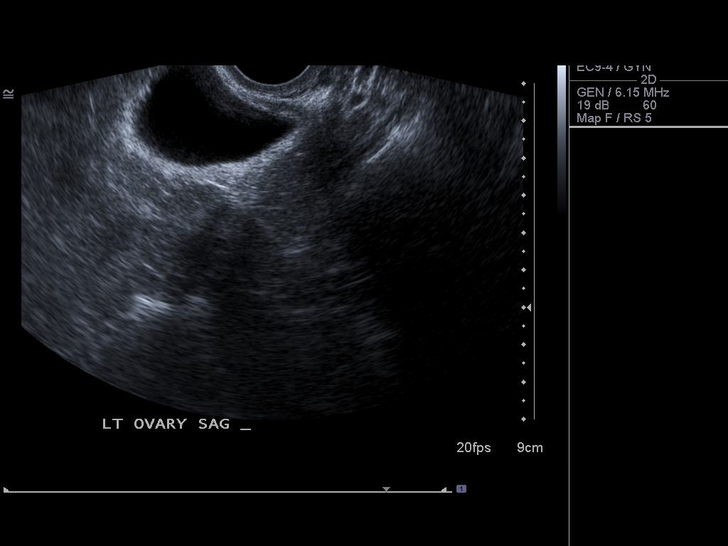
[im 53/64]
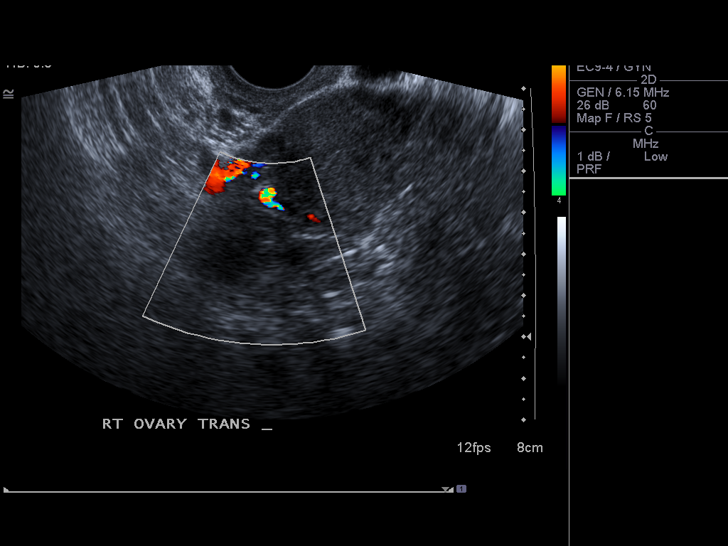
[im 58/64]
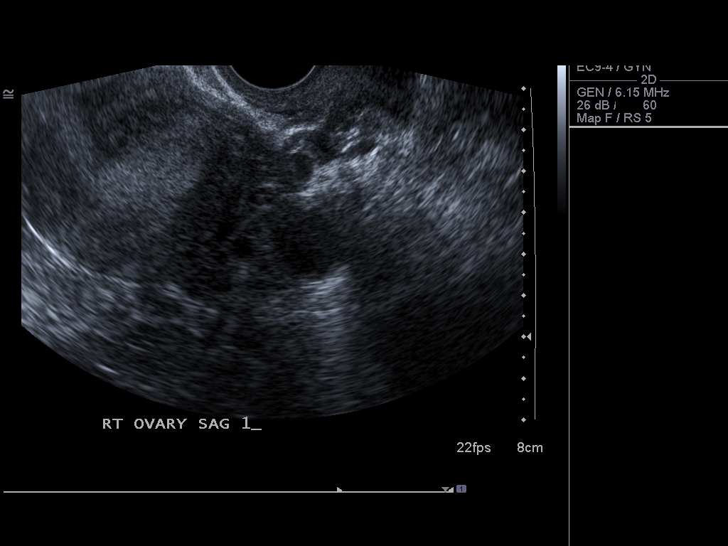
[im 64/64]
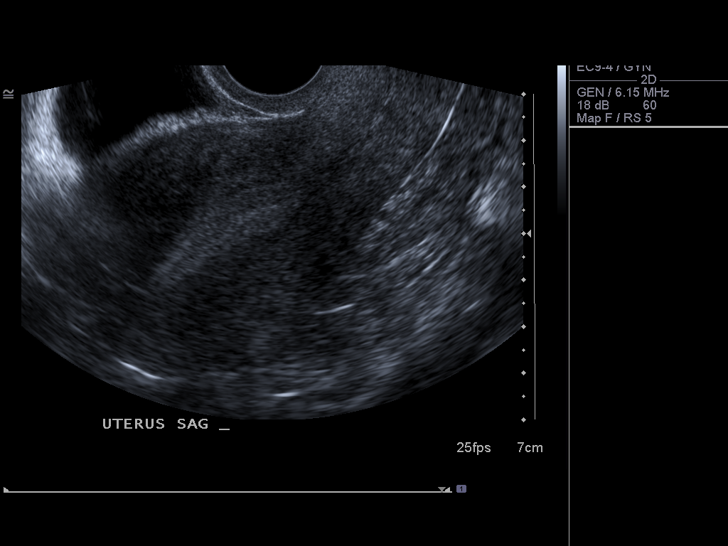

[14 of 25 positions shown; findings below may reference images not displayed]

FINDINGS: Uterus:  10.5 x 5.2 x 5.8 cm.  Nabothian cysts.

Endometrium:  Normal for age, maximally 1.3 cm.

Right Ovary: 3.6 x 1.9 x 2.4 cm. Suspect a minimally complex
follicle within. 1.7 cm on image 59.

Left Ovary:  2.8 x 2.1 x 2.0 cm.  Normal in morphology.

Other Findings:  No significant free fluid.
IMPRESSION: 1.  No acute process or explanation for left lower quadrant pain.
2. Probable complex follicle in the right ovary.

## 2016-12-23 ENCOUNTER — Encounter: Payer: Self-pay | Admitting: Emergency Medicine

## 2016-12-23 ENCOUNTER — Emergency Department (INDEPENDENT_AMBULATORY_CARE_PROVIDER_SITE_OTHER)
Admission: EM | Admit: 2016-12-23 | Discharge: 2016-12-23 | Disposition: A | Discharge status: Home / Self Care | Payer: BLUE CROSS/BLUE SHIELD | Source: Home / Self Care | Attending: Family Medicine | Admitting: Family Medicine

## 2016-12-23 DIAGNOSIS — J069 Acute upper respiratory infection, unspecified: Secondary | ICD-10-CM

## 2016-12-23 DIAGNOSIS — R05 Cough: Secondary | ICD-10-CM

## 2016-12-23 DIAGNOSIS — R059 Cough, unspecified: Secondary | ICD-10-CM

## 2016-12-23 MED ORDER — BENZONATATE 100 MG PO CAPS: 100.0000 mg | ORAL_CAPSULE | Freq: Three times a day (TID) | ORAL | 0 refills | Status: DC

## 2016-12-23 MED ORDER — AZITHROMYCIN 250 MG PO TABS: 250.0000 mg | ORAL_TABLET | Freq: Every day | ORAL | 0 refills | Status: DC

## 2016-12-23 NOTE — ED Provider Notes (Signed)
CSN: [id number]     Arrival date & time 12/23/16  1550 History   First MD Initiated Contact with Patient 12/23/16 1612     Chief Complaint  Patient presents with  . Cough  . Nasal Congestion  . Otalgia   (Consider location/radiation/quality/duration/timing/severity/associated sxs/prior Treatment) HPI  Laura Juarez is a 36 y.o. female presenting to UC with c/o of 2 weeks of worsening sinsus congestion, pain and pressure, associated nasal drainage with mild sore throat.  She has tried OTC cough/cold medications for the last 1 week w/o relief.  Denies fever, chills, n/v/d. Hx of asthma but denies SOB at this time.    Past Medical History:  Diagnosis Date  . Asthma   . Depression    Past Surgical History:  Procedure Laterality Date  . TONSILLECTOMY    . TUBAL LIGATION     History reviewed. No pertinent family history. Social History  Substance Use Topics  . Smoking status: Current Every Day Smoker  . Smokeless tobacco: Never Used  . Alcohol use No   OB History    No data available     Review of Systems  Constitutional: Negative for chills and fever.  HENT: Positive for congestion, ear pain (pressure), postnasal drip, rhinorrhea, sinus pain, sinus pressure and sore throat. Negative for trouble swallowing and voice change.   Respiratory: Positive for cough. Negative for shortness of breath.   Cardiovascular: Negative for chest pain and palpitations.  Gastrointestinal: Negative for abdominal pain, diarrhea, nausea and vomiting.  Musculoskeletal: Negative for arthralgias, back pain and myalgias.  Skin: Negative for rash.  Neurological: Positive for headaches. Negative for dizziness and light-headedness.    Allergies  Augmentin [amoxicillin-pot clavulanate]  Home Medications   Prior to Admission medications   Medication Sig Start Date End Date Taking? Authorizing Provider  azithromycin (ZITHROMAX) 250 MG tablet Take 1 tablet (250 mg total) by mouth daily. Take  first 2 tablets together, then 1 every day until finished. 12/23/16   Junius Finner, PA-C  benzonatate (TESSALON) 100 MG capsule Take 1-2 capsules (100-200 mg total) by mouth every 8 (eight) hours. 12/23/16   Junius Finner, PA-C  Escitalopram Oxalate (LEXAPRO PO) Take by mouth.    [provider]  HYDROcodone-acetaminophen (NORCO) 5-325 MG per tablet Take 1-2 tablets by mouth every 6 (six) hours as needed (for pain). 03/04/14   Molpus, John, MD  ibuprofen (ADVIL,MOTRIN) 200 MG tablet Take 200 mg by mouth every 6 (six) hours as needed.    [provider]  nitrofurantoin, macrocrystal-monohydrate, (MACROBID) 100 MG capsule Take 1 capsule (100 mg total) by mouth 2 (two) times daily. X 7 days 03/04/14   Molpus, John, MD   Meds Ordered and Administered this Visit  Medications - No data to display  BP 123/78 (BP Location: Left Arm)   Pulse 77   Temp 98.3 F (36.8 C) (Oral)   Resp 16   Ht 5\' 3"  (1.6 m)   Wt 209 lb (94.8 kg)   LMP 02/12/2014   SpO2 96%   BMI 37.02 kg/m  No data found.   Physical Exam  Constitutional: She is oriented to person, place, and time. She appears well-developed and well-nourished. No distress.  HENT:  Head: Normocephalic and atraumatic.  Right Ear: Tympanic membrane normal.  Left Ear: Tympanic membrane normal.  Nose: Mucosal edema present. Right sinus exhibits no maxillary sinus tenderness and no frontal sinus tenderness. Left sinus exhibits no maxillary sinus tenderness and no frontal sinus tenderness.  Mouth/Throat:  Uvula is midline, oropharynx is clear and moist and mucous membranes are normal.  Eyes: EOM are normal.  Neck: Normal range of motion. Neck supple.  Cardiovascular: Normal rate and regular rhythm.   Pulmonary/Chest: Effort normal and breath sounds normal. No stridor. No respiratory distress. She has no wheezes. She has no rales.  Musculoskeletal: Normal range of motion.  Lymphadenopathy:    She has no cervical adenopathy.   Neurological: She is alert and oriented to person, place, and time.  Skin: Skin is warm and dry. She is not diaphoretic.  Psychiatric: She has a normal mood and affect. Her behavior is normal.  Nursing note and vitals reviewed.   Urgent Care Course     Procedures (including critical care time)  Labs Review Labs Reviewed - No data to display  Imaging Review No results found.   MDM   1. Upper respiratory tract infection, unspecified type   2. Cough    Will cover for bacterial cause given duration of worsening symptoms.  Rx: Azithromycin and tessalon F/u with PCP in 1 week if not improving.    Junius FinnerO'Malley, Laura Miao, PA-C 12/23/16 1655

## 2016-12-23 NOTE — ED Triage Notes (Signed)
[facility]O cough, cold and congestion with bilateral ear pain, nasal drainage and mild sore throat, symptoms times two weeks worse over the last week.

## 2019-02-20 ENCOUNTER — Other Ambulatory Visit: Payer: Self-pay

## 2019-02-20 ENCOUNTER — Emergency Department
Admission: EM | Admit: 2019-02-20 | Discharge: 2019-02-20 | Disposition: A | Discharge status: Home / Self Care | Payer: 59 | Source: Home / Self Care | Attending: Emergency Medicine | Admitting: Emergency Medicine

## 2019-02-20 DIAGNOSIS — L040 Acute lymphadenitis of face, head and neck: Secondary | ICD-10-CM | POA: Diagnosis not present

## 2019-02-20 DIAGNOSIS — H9201 Otalgia, right ear: Secondary | ICD-10-CM

## 2019-02-20 MED ORDER — AMOXICILLIN 875 MG PO TABS: ORAL_TABLET | ORAL | 0 refills | Status: DC

## 2019-02-20 NOTE — ED Provider Notes (Signed)
[facility]    CSN: [id number] Arrival date & time: 02/20/19  1315     History   Chief Complaint Chief Complaint  Patient presents with  . Otalgia    right    HPI Laura Juarez is a 38 y.o. female.   The history is provided by the patient.  Onset about 3 days ago. Right ear pain with swollen lymph node on neck.  Denies sore throat, fever. Has minimal nasal congestion.  No discolored rhinorrhea or fever.  Pain level was 6 today, but now it is a 4.  She tried using Flonase and that may have helped.  She feels the lymph node right anterior neck is actually decreasing in size the past day or so but she wanted to have it checked. Past Medical History:  Diagnosis Date  . Asthma   . Depression     Patient Active Problem List   Diagnosis Date Noted  . DEPRESSION 08/11/2008  . Unspecified asthma(493.90) 08/11/2008    Past Surgical History:  Procedure Laterality Date  . CHOLECYSTECTOMY    . TONSILLECTOMY    . TUBAL LIGATION      OB History   No obstetric history on file.    Patient's last menstrual period was 02/12/2014.   Home Medications    Prior to Admission medications   Medication Sig Start Date End Date Taking? Authorizing Provider  sertraline (ZOLOFT) 100 MG tablet Take 200 mg by mouth daily.   Yes [provider]  amoxicillin (AMOXIL) 875 MG tablet Take 1 twice a day X 10 days. 02/20/19   Lajean ManesMassey, David, MD  Escitalopram Oxalate (LEXAPRO PO) Take by mouth.    [provider]  ibuprofen (ADVIL,MOTRIN) 200 MG tablet Take 200 mg by mouth every 6 (six) hours as needed.    [provider]    Family History History reviewed. No pertinent family history.  Social History Social History   Tobacco Use  . Smoking status: Current Every Day Smoker  . Smokeless tobacco: Never Used  . Tobacco comment: 3-4 a day  Substance Use Topics  . Alcohol use: No  . Drug use: No     Allergies   Augmentin [amoxicillin-pot  clavulanate] and Latex   Review of Systems Review of Systems  Pertinent items noted in HPI and remainder of comprehensive ROS otherwise negative.  Physical Exam Triage Vital Signs ED Triage Vitals  Enc Vitals Group     BP 02/20/19 1334 (!) 147/86     Pulse Rate 02/20/19 1334 92     Resp 02/20/19 1334 20     Temp 02/20/19 1334 98.3 F (36.8 C)     Temp Source 02/20/19 1334 Oral     SpO2 02/20/19 1334 96 %     Weight 02/20/19 1336 205 lb (93 kg)     Height 02/20/19 1336 5\' 3"  (1.6 m)     Head Circumference --      Peak Flow --      Pain Score 02/20/19 1335 6     Pain Loc --      Pain Edu? --      Excl. in GC? --    No data found.  Updated Vital Signs BP (!) 147/86 (BP Location: Right Arm)   Pulse 92   Temp 98.3 F (36.8 C) (Oral)   Resp 20   Ht 5\' 3"  (1.6 m)   Wt 93 kg   LMP 02/12/2014   SpO2 96%   BMI  36.31 kg/m   Visual Acuity Right Eye Distance:   Left Eye Distance:   Bilateral Distance:    Right Eye Near:   Left Eye Near:    Bilateral Near:     Physical Exam Vitals signs and nursing note reviewed.  Constitutional:      General: She is not in acute distress.    Appearance: She is well-developed.  HENT:     Head: Normocephalic and atraumatic.     Right Ear: Ear canal and external ear normal. No drainage, swelling or tenderness. A middle ear effusion is present. Tympanic membrane is injected (Minimal). Tympanic membrane is not perforated or erythematous.     Left Ear: Tympanic membrane, ear canal and external ear normal. No drainage, swelling or tenderness. Tympanic membrane is not perforated or erythematous.     Ears:     Comments: Right TM: Normal landmarks, except mild middle ear effusion/air-fluid level.  No redness or bulging.    Nose: Mucosal edema and rhinorrhea (Mild) present.     Mouth/Throat:     Mouth: No oral lesions.  Eyes:     General: No scleral icterus.    Conjunctiva/sclera: Conjunctivae normal.  Neck:     Musculoskeletal: Neck  supple.  Cardiovascular:     Rate and Rhythm: Normal rate and regular rhythm.     Heart sounds: Normal heart sounds.  Pulmonary:     Effort: Pulmonary effort is normal.     Breath sounds: Normal breath sounds.  Lymphadenopathy:     Cervical: Cervical adenopathy present.     Right cervical: Superficial cervical adenopathy (5 mm, rubbery, mobile, nontender anterior cervical node) present.  Skin:    General: Skin is warm and dry.     Comments: No rash  Neurological:     Mental Status: She is alert and oriented to person, place, and time.      UC Treatments / Results  Labs (all labs ordered are listed, but only abnormal results are displayed) Labs Reviewed - No data to display  EKG   Radiology No results found.  Procedures Procedures (including critical care time)  Medications Ordered in UC Medications - No data to display  Initial Impression / Assessment and Plan / UC Course  I have reviewed the triage vital signs and the nursing notes.  Pertinent labs & imaging results that were available during my care of the patient were reviewed by me and considered in my medical decision making (see chart for details).     Likely has mild viral URI that is resolving on its own with secondary right otalgia/eustachian tube dysfunction.  The tiny right anterior cervical node is not worrisome and resolving by history. Discussed treatment options verbal and written instructions given, see below in AVS. If no better in 1 week, follow-up with PCP.  If any severe symptoms or red flags, go to ER. I did give her a printed prescription for amoxicillin to fill if she is not improving in several days, to cover possible secondary bacterial infection.  Of note, she has specific allergy to the clavulanate and Augmentin, but she has taken plain amoxicillin many times in the past without problems.   She voiced understanding and agreement. Final Clinical Impressions(s) / UC Diagnoses   Final  diagnoses:  Acute cervical adenitis  Right ear pain     Discharge Instructions     On physical exam today, there is no sign of an ear infection. Treatment: Continue Nasonex and loratadine-antihistamine that you are already  taking. Add Sudafed as a decongestant which may help pressure and fluid in right eustachian tube. I printed a prescription for amoxicillin, fill and start taking if you develop fever or worsening ear pain or worsening swelling right lymph node. If any severe worsening symptoms, go to emergency room. Otherwise, may return to work Monday, 02/23/2019 without restrictions.    ED Prescriptions    Medication Sig Dispense Auth. Provider   amoxicillin (AMOXIL) 875 MG tablet Take 1 twice a day X 10 days. 20 tablet Jacqulyn Cane, MD        Jacqulyn Cane, MD 02/22/19 1455

## 2019-02-20 NOTE — Discharge Instructions (Addendum)
On physical exam today, there is no sign of an ear infection. Treatment: Continue Nasonex and loratadine-antihistamine that you are already taking. Add Sudafed as a decongestant which may help pressure and fluid in right eustachian tube. I printed a prescription for amoxicillin, fill and start taking if you develop fever or worsening ear pain or worsening swelling right lymph node. If any severe worsening symptoms, go to emergency room. Otherwise, may return to work Monday, 02/23/2019 without restrictions.

## 2019-02-20 NOTE — ED Triage Notes (Signed)
Right ear pain with swollen lymph node on neck.  Denies sore throat, fever.

## 2019-03-27 ENCOUNTER — Encounter: Payer: Self-pay | Admitting: Emergency Medicine

## 2019-03-27 ENCOUNTER — Emergency Department
Admission: EM | Admit: 2019-03-27 | Discharge: 2019-03-27 | Disposition: A | Discharge status: Home / Self Care | Payer: 59 | Source: Home / Self Care

## 2019-03-27 ENCOUNTER — Other Ambulatory Visit: Payer: Self-pay

## 2019-03-27 DIAGNOSIS — H6693 Otitis media, unspecified, bilateral: Secondary | ICD-10-CM

## 2019-03-27 MED ORDER — AMOXICILLIN 875 MG PO TABS: ORAL_TABLET | ORAL | 0 refills | Status: AC

## 2019-03-27 NOTE — ED Triage Notes (Signed)
Patient reports left ear pain over past 2 days; took ibuprofen this morning; some mild congestion and throat irritation; no known fever. She has not travelled past 4 weeks.

## 2019-03-27 NOTE — ED Provider Notes (Signed)
[facility]    CSN: [id number] Arrival date & time: 03/27/19  0801      History   Chief Complaint Chief Complaint  Patient presents with  . Otalgia    left    HPI Laura Juarez is a 38 y.o. female.   HPI  Laura Juarez is a 38 y.o. female presenting to UC with c/o gradually worsening Left ear pain for the last 2 days.  Mild congestion and throat irritation but no known fever.   Hx of Right ear pain last month that resolved on its own within a few days.  Denies fever, chills, n/v/d. Denies HA or dizziness. No cough or SOB. No recent travel or sick contacts.    Past Medical History:  Diagnosis Date  . Asthma   . Depression     Patient Active Problem List   Diagnosis Date Noted  . DEPRESSION 08/11/2008  . Unspecified asthma(493.90) 08/11/2008    Past Surgical History:  Procedure Laterality Date  . CHOLECYSTECTOMY    . TONSILLECTOMY    . TUBAL LIGATION      OB History   No obstetric history on file.      Home Medications    Prior to Admission medications   Medication Sig Start Date End Date Taking? Authorizing Provider  fluticasone (FLONASE) 50 MCG/ACT nasal spray Place into both nostrils daily.   Yes [provider]  loratadine (CLARITIN) 10 MG tablet Take 10 mg by mouth daily.   Yes [provider]  amoxicillin (AMOXIL) 875 MG tablet Take 1 twice a day X 10 days. 03/27/19   Lurene ShadowPhelps, Deaunte Dente O, PA-C  Escitalopram Oxalate (LEXAPRO PO) Take by mouth.    [provider]  ibuprofen (ADVIL,MOTRIN) 200 MG tablet Take 200 mg by mouth every 6 (six) hours as needed.    [provider]  sertraline (ZOLOFT) 100 MG tablet Take 200 mg by mouth daily.    [provider]    Family History No family history on file.  Social History Social History   Tobacco Use  . Smoking status: Current Every Day Smoker  . Smokeless tobacco: Never Used  . Tobacco comment: 3-4 a day  Substance Use Topics  .  Alcohol use: No  . Drug use: No     Allergies   Augmentin [amoxicillin-pot clavulanate] and Latex   Review of Systems Review of Systems  Constitutional: Negative for chills and fever.  HENT: Positive for congestion, ear pain (Left), postnasal drip (mild) and sore throat (scratchy). Negative for sinus pressure, sinus pain, trouble swallowing and voice change.   Respiratory: Negative for cough and shortness of breath.   Cardiovascular: Negative for chest pain and palpitations.  Gastrointestinal: Negative for abdominal pain, diarrhea, nausea and vomiting.  Musculoskeletal: Negative for arthralgias, back pain and myalgias.  Skin: Negative for rash.  Neurological: Negative for dizziness, light-headedness and headaches.     Physical Exam Triage Vital Signs ED Triage Vitals  Enc Vitals Group     BP 03/27/19 0823 138/84     Pulse Rate 03/27/19 0823 76     Resp 03/27/19 0823 16     Temp 03/27/19 0823 98.2 F (36.8 C)     Temp Source 03/27/19 0823 Oral     SpO2 03/27/19 0823 97 %     Weight 03/27/19 0824 201 lb (91.2 kg)     Height 03/27/19 0824 5\' 3"  (1.6 m)     Head Circumference --  Peak Flow --      Pain Score 03/27/19 0824 7     Pain Loc --      Pain Edu? --      Excl. in GC? --    No data found.  Updated Vital Signs BP 138/84 (BP Location: Right Arm)   Pulse 76   Temp 98.2 F (36.8 C) (Oral)   Resp 16   Ht 5\' 3"  (1.6 m)   Wt 201 lb (91.2 kg)   LMP 02/12/2014   SpO2 97%   BMI 35.61 kg/m     Physical Exam Vitals signs and nursing note reviewed.  Constitutional:      Appearance: Normal appearance. She is well-developed.  HENT:     Head: Normocephalic and atraumatic.     Right Ear: No mastoid tenderness. Tympanic membrane is scarred and erythematous. Tympanic membrane is not bulging.     Left Ear: A middle ear effusion is present. No mastoid tenderness. Tympanic membrane is injected. Tympanic membrane is not erythematous or bulging.     Nose: Nose normal.      Right Sinus: No maxillary sinus tenderness or frontal sinus tenderness.     Left Sinus: No maxillary sinus tenderness or frontal sinus tenderness.     Mouth/Throat:     Lips: Pink.     Mouth: Mucous membranes are moist.     Pharynx: Oropharynx is clear. Uvula midline.  Neck:     Musculoskeletal: Normal range of motion.  Cardiovascular:     Rate and Rhythm: Normal rate and regular rhythm.  Pulmonary:     Effort: Pulmonary effort is normal. No respiratory distress.     Breath sounds: Normal breath sounds. No stridor. No wheezing, rhonchi or rales.  Musculoskeletal: Normal range of motion.  Skin:    General: Skin is warm and dry.  Neurological:     Mental Status: She is alert and oriented to person, place, and time.  Psychiatric:        Behavior: Behavior normal.      UC Treatments / Results  Labs (all labs ordered are listed, but only abnormal results are displayed) Labs Reviewed - No data to display  EKG   Radiology No results found.  Procedures Procedures (including critical care time)  Medications Ordered in UC Medications - No data to display  Tympanometry: Left ear: Tympanogram is wide. Right ear: positive tympanometric peak pressure  Initial Impression / Assessment and Plan / UC Course  I have reviewed the triage vital signs and the nursing notes.  Pertinent labs & imaging results that were available during my care of the patient were reviewed by me and considered in my medical decision making (see chart for details).     Hx and exam along with tympanogram, c/w bilateral AOM  Will start pt on amoxicillin, pt has had before w/o reaction. Encouraged to take with food to help prevent GI upset. F/u with PCP in 1 week as needed.  Final Clinical Impressions(s) / UC Diagnoses   Final diagnoses:  Bilateral acute otitis media     Discharge Instructions      Please take antibiotics as prescribed and be sure to complete entire course even if you start to  feel better to ensure infection does not come back.  You may take 500mg  acetaminophen every 4-6 hours or in combination with ibuprofen 400-600mg  every 6-8 hours as needed for pain, inflammation, and fever.  Be sure to well hydrated with clear liquids and get at least  8 hours of sleep at night, preferably more while sick.   Please follow up with family medicine in 1 week if needed.     ED Prescriptions    Medication Sig Dispense Auth. Provider   amoxicillin (AMOXIL) 875 MG tablet Take 1 twice a day X 10 days. 20 tablet Noe Gens, PA-C     Controlled Substance Prescriptions [place] Controlled Substance Registry consulted? Not Applicable   Tyrell Antonio 03/27/19 [id number]

## 2019-03-27 NOTE — Discharge Instructions (Signed)

## 2019-03-29 ENCOUNTER — Telehealth: Payer: Self-pay | Admitting: Emergency Medicine

## 2019-03-29 NOTE — Telephone Encounter (Signed)
[place] if doing well to disregard the call, any questions or concerns, please contact the office or follow up with PCP.

## 2019-04-06 ENCOUNTER — Telehealth: Payer: Self-pay
